# Patient Record
Sex: Male | Born: 1955 | Race: Black or African American | Hispanic: No | Marital: Single | Smoking: Never smoker
Health system: Southern US, Community
[De-identification: ages and names within clinical notes are randomized; demographics above are authoritative.]

## PROBLEM LIST (undated history)

## (undated) DIAGNOSIS — I10 Essential (primary) hypertension: Secondary | ICD-10-CM

## (undated) DIAGNOSIS — E119 Type 2 diabetes mellitus without complications: Secondary | ICD-10-CM

---

## 2000-01-02 ENCOUNTER — Encounter: Payer: Self-pay | Admitting: *Deleted

## 2000-01-02 ENCOUNTER — Emergency Department (HOSPITAL_COMMUNITY): Admission: EM | Admit: 2000-01-02 | Discharge: 2000-01-02 | Payer: Self-pay | Admitting: *Deleted

## 2001-12-06 ENCOUNTER — Ambulatory Visit (HOSPITAL_COMMUNITY): Admission: RE | Admit: 2001-12-06 | Discharge: 2001-12-06 | Payer: Self-pay | Admitting: Gastroenterology

## 2002-10-23 ENCOUNTER — Encounter: Payer: Self-pay | Admitting: Internal Medicine

## 2002-10-23 ENCOUNTER — Encounter: Admission: RE | Admit: 2002-10-23 | Discharge: 2002-10-23 | Payer: Self-pay | Admitting: Internal Medicine

## 2002-12-19 ENCOUNTER — Encounter: Payer: Self-pay | Admitting: Emergency Medicine

## 2002-12-19 ENCOUNTER — Emergency Department (HOSPITAL_COMMUNITY): Admission: EM | Admit: 2002-12-19 | Discharge: 2002-12-19 | Payer: Self-pay | Admitting: Emergency Medicine

## 2003-05-21 ENCOUNTER — Emergency Department (HOSPITAL_COMMUNITY): Admission: EM | Admit: 2003-05-21 | Discharge: 2003-05-22 | Payer: Self-pay | Admitting: Emergency Medicine

## 2005-04-09 ENCOUNTER — Emergency Department (HOSPITAL_COMMUNITY): Admission: EM | Admit: 2005-04-09 | Discharge: 2005-04-09 | Payer: Self-pay | Admitting: Emergency Medicine

## 2006-01-31 ENCOUNTER — Emergency Department (HOSPITAL_COMMUNITY): Admission: EM | Admit: 2006-01-31 | Discharge: 2006-01-31 | Payer: Self-pay | Admitting: Emergency Medicine

## 2006-02-13 ENCOUNTER — Encounter (INDEPENDENT_AMBULATORY_CARE_PROVIDER_SITE_OTHER): Payer: Self-pay | Admitting: Cardiology

## 2006-02-13 ENCOUNTER — Ambulatory Visit (HOSPITAL_COMMUNITY): Admission: RE | Admit: 2006-02-13 | Discharge: 2006-02-13 | Payer: Self-pay | Admitting: Internal Medicine

## 2006-11-28 ENCOUNTER — Emergency Department (HOSPITAL_COMMUNITY): Admission: EM | Admit: 2006-11-28 | Discharge: 2006-11-28 | Payer: Self-pay | Admitting: Emergency Medicine

## 2007-08-07 ENCOUNTER — Ambulatory Visit: Payer: Self-pay | Admitting: Cardiology

## 2007-08-07 ENCOUNTER — Ambulatory Visit (HOSPITAL_COMMUNITY): Admission: RE | Admit: 2007-08-07 | Discharge: 2007-08-07 | Payer: Self-pay | Admitting: Internal Medicine

## 2007-12-05 ENCOUNTER — Emergency Department (HOSPITAL_COMMUNITY): Admission: EM | Admit: 2007-12-05 | Discharge: 2007-12-05 | Payer: Self-pay | Admitting: Emergency Medicine

## 2008-05-01 ENCOUNTER — Emergency Department (HOSPITAL_COMMUNITY): Admission: EM | Admit: 2008-05-01 | Discharge: 2008-05-02 | Payer: Self-pay | Admitting: Emergency Medicine

## 2008-06-15 IMAGING — CR DG CHEST 1V PORT
1 series · 1 of 1 positions shown · non-contrast
Comparison: none

HISTORY: Chest pain, hypertension

PORTABLE CHEST ONE VIEW:
Upper normal heart size for technique.
Normal mediastinal contours and pulmonary vascularity.
Lungs clear.
No pleural effusion or pneumothorax.
Numerous cardiac monitoring lines project over chest.

[AP]
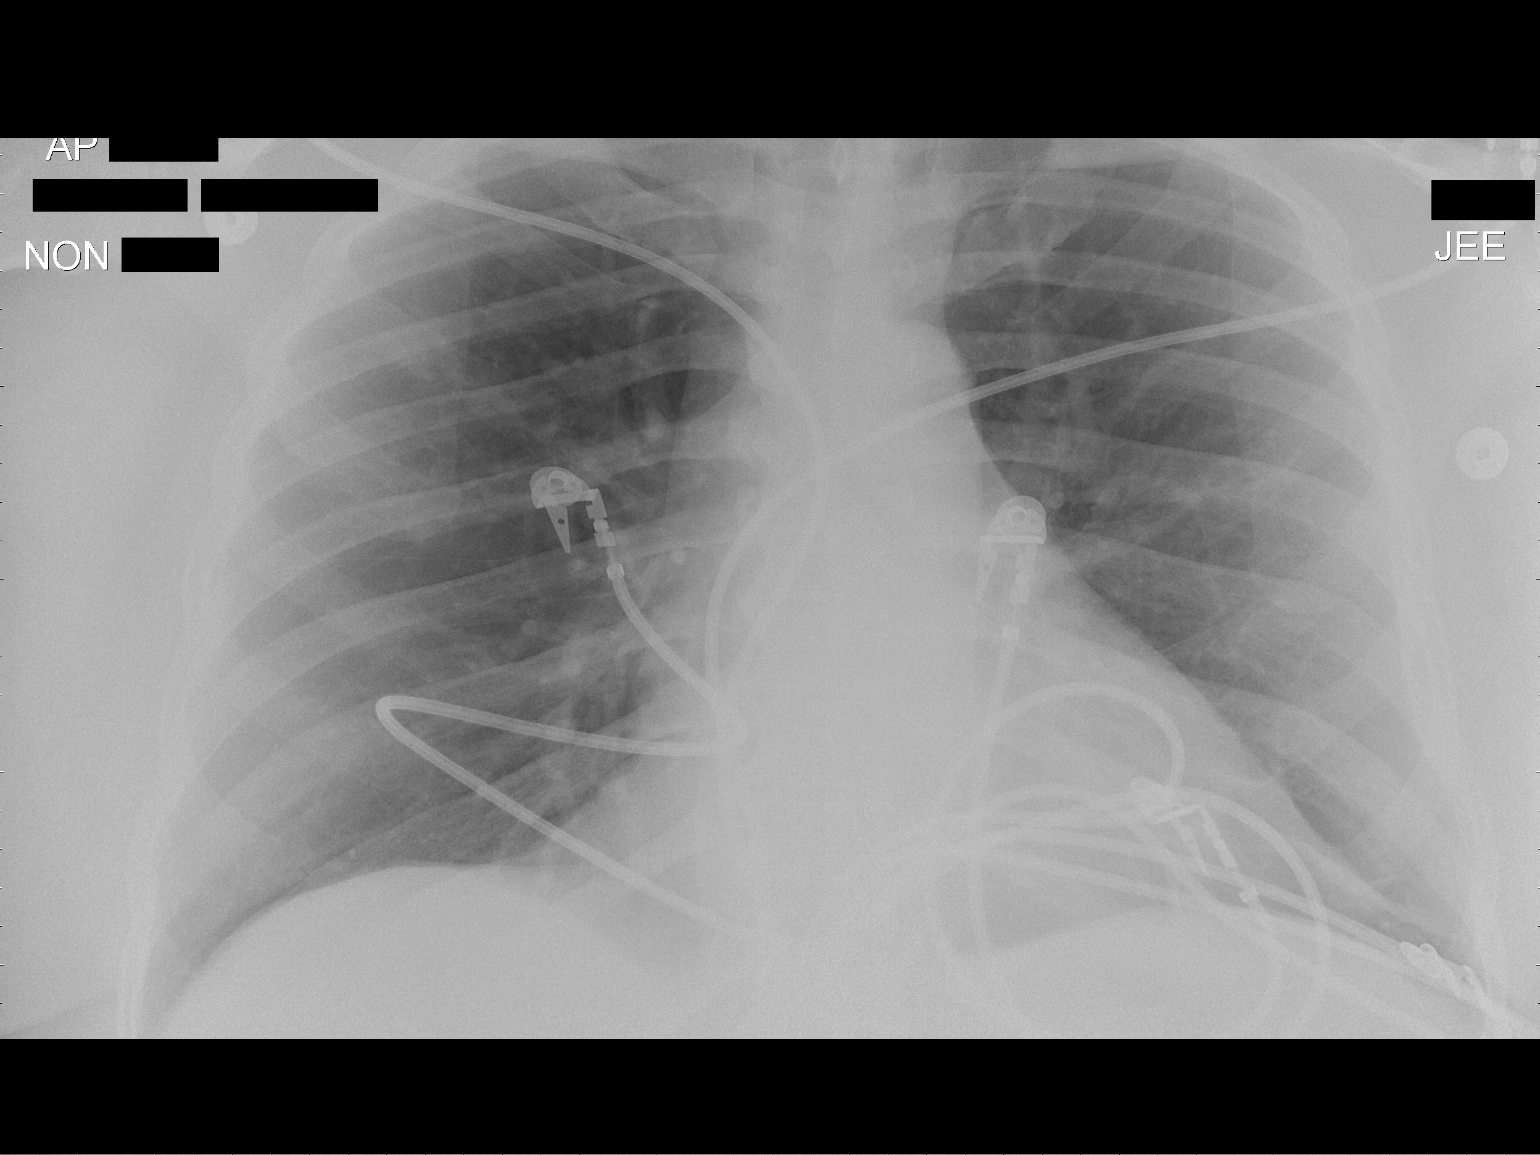

[1 of 1 positions shown; findings below may reference images not displayed]

IMPRESSION: No acute abnormalities.

## 2008-11-11 IMAGING — US US ABDOMEN COMPLETE
1 series · 14 of 25 positions shown · non-contrast
Comparison: None

CLINICAL DATA: Abdominal pain, right-sided flank pain

ABDOMEN ULTRASOUND
TECHNIQUE: Complete abdominal ultrasound examination was performed
including evaluation of the liver, gallbladder, bile ducts,
pancreas, kidneys, spleen, IVC, and abdominal aorta.

[Series 1: unknown · 0.34mm/px · 14 of 56 slices shown]
[im 1/56]
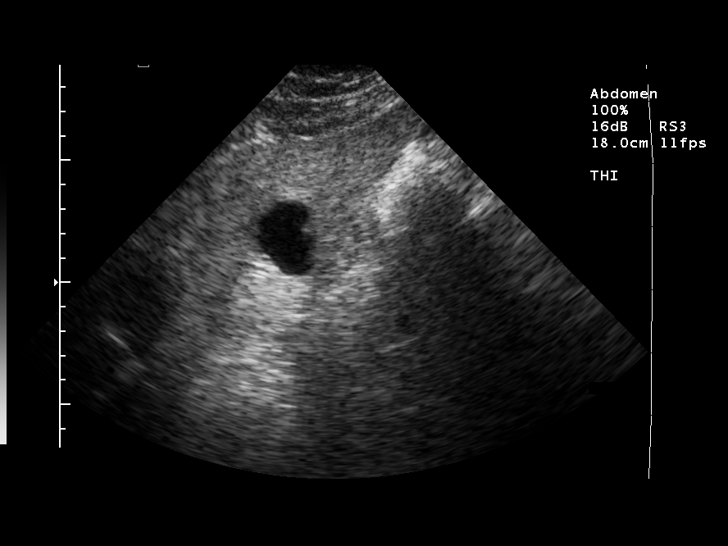
[im 5/56]
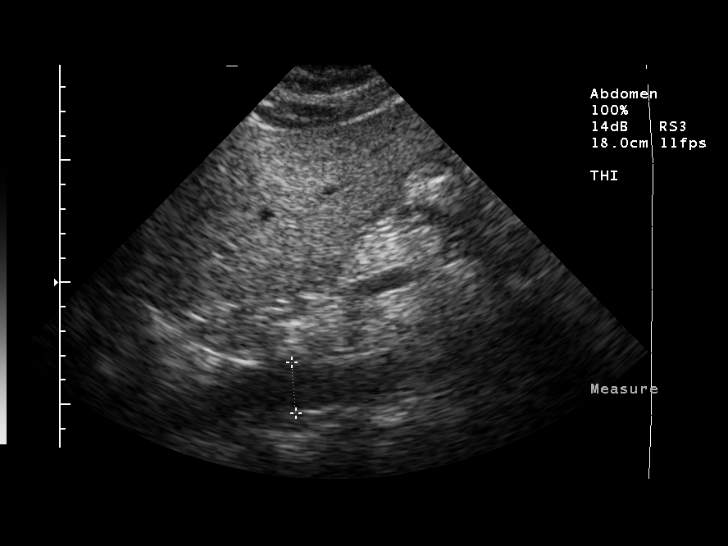
[im 10/56]
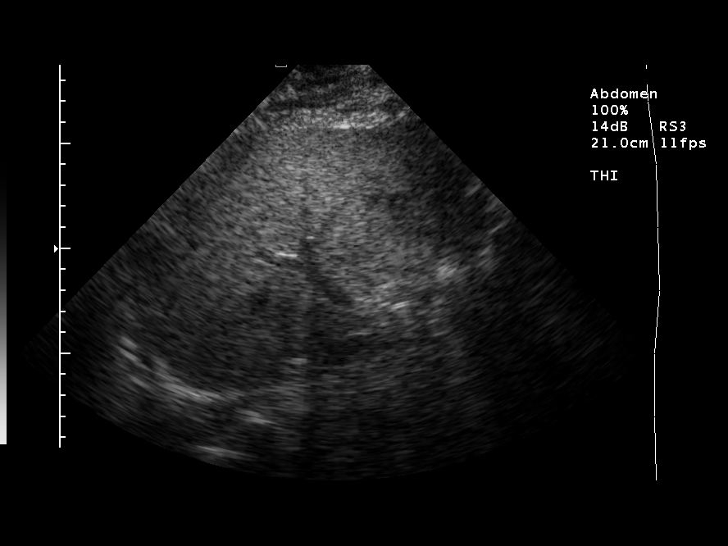
[im 14/56]
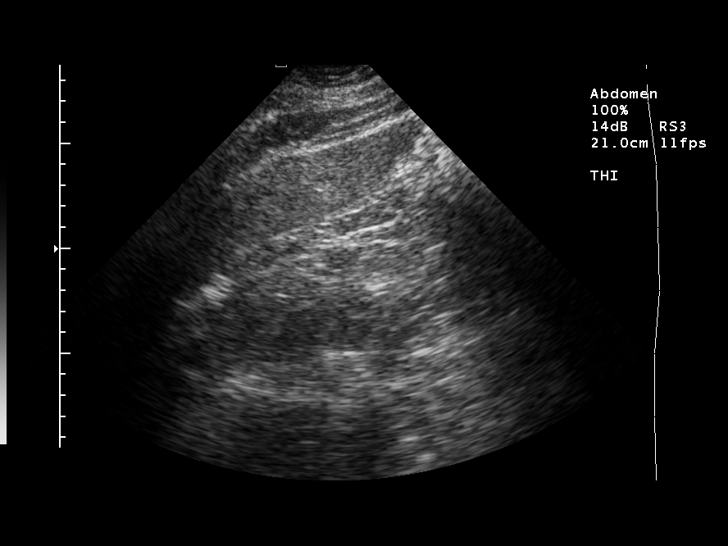
[im 19/56]
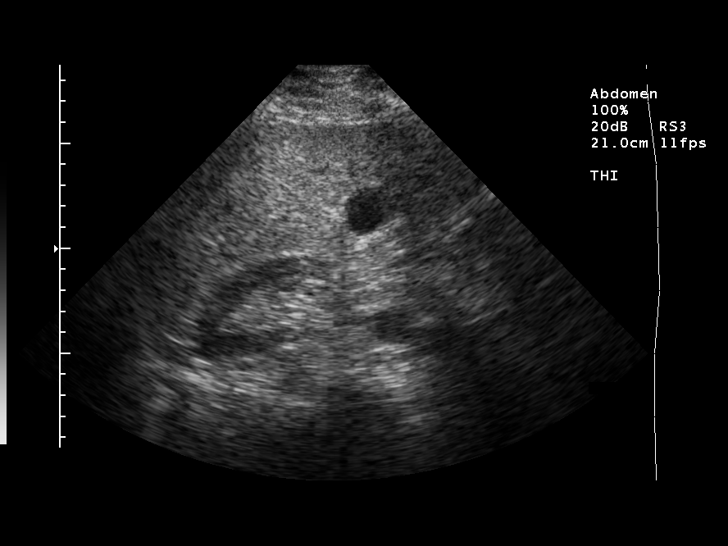
[im 21/56]
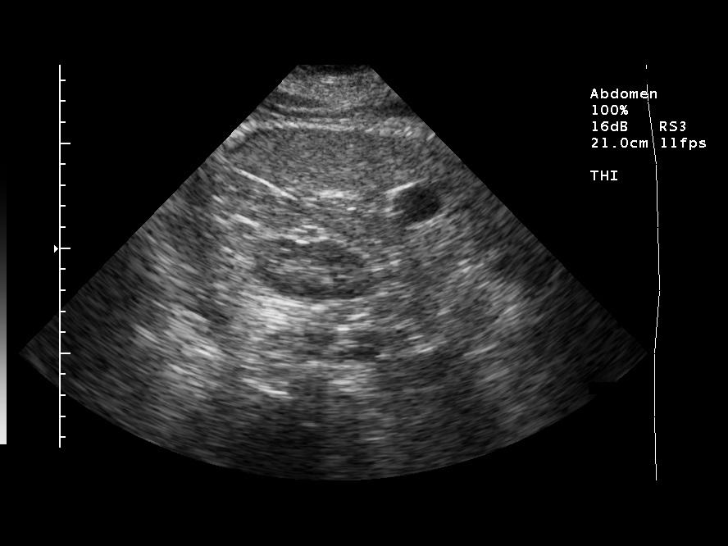
[im 26/56]
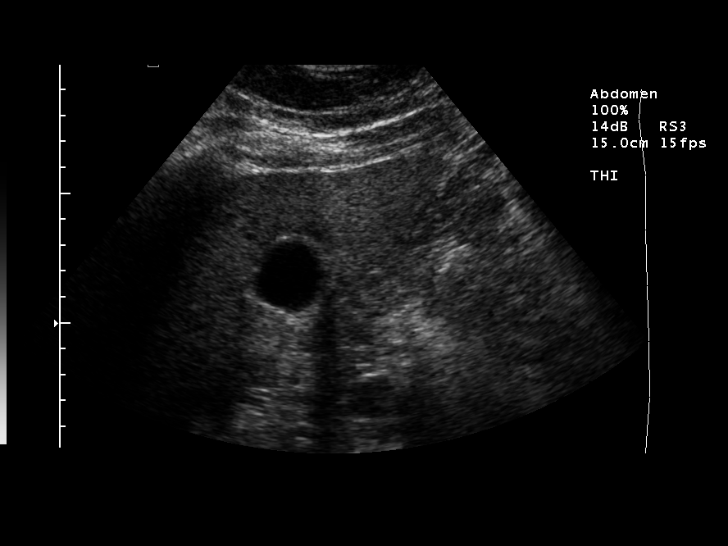
[im 30/56]
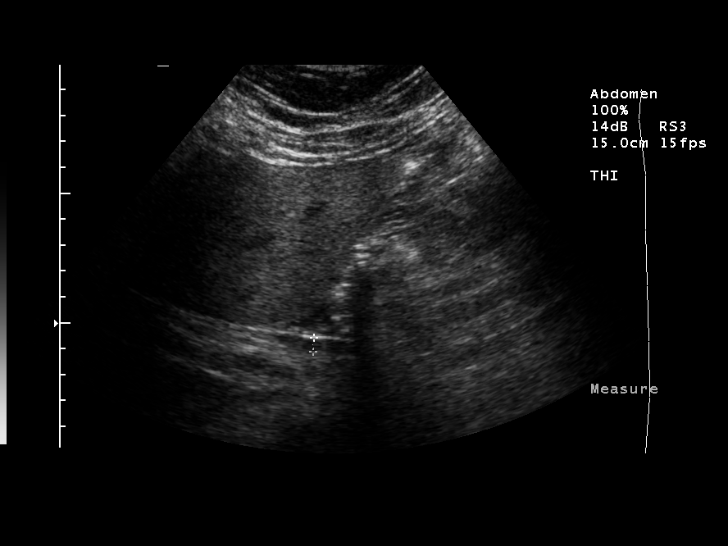
[im 35/56]
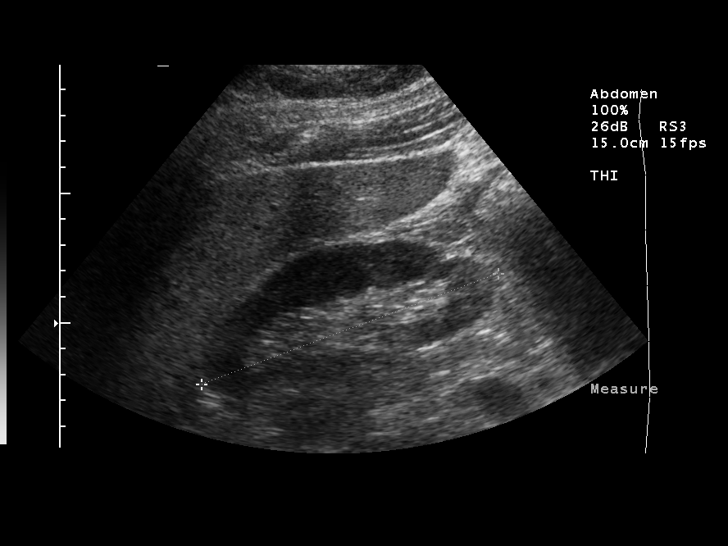
[im 37/56]
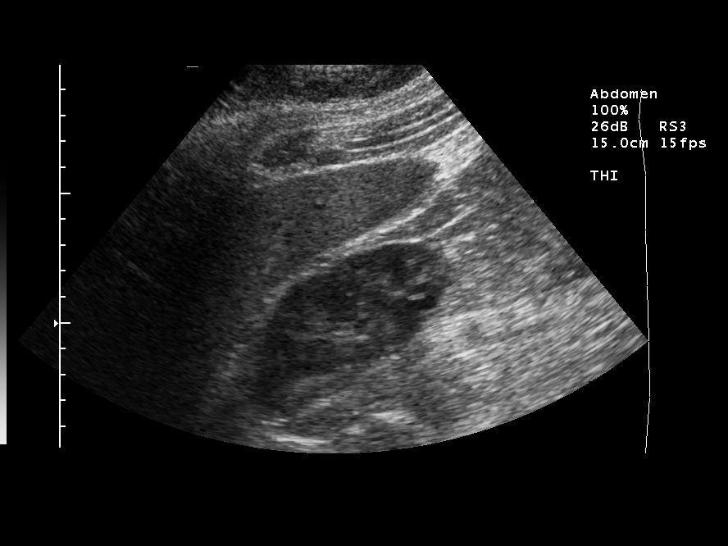
[im 42/56]
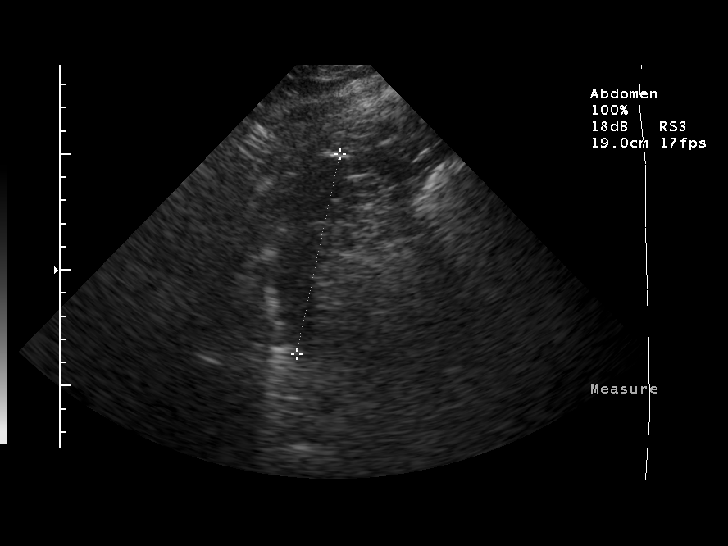
[im 46/56]
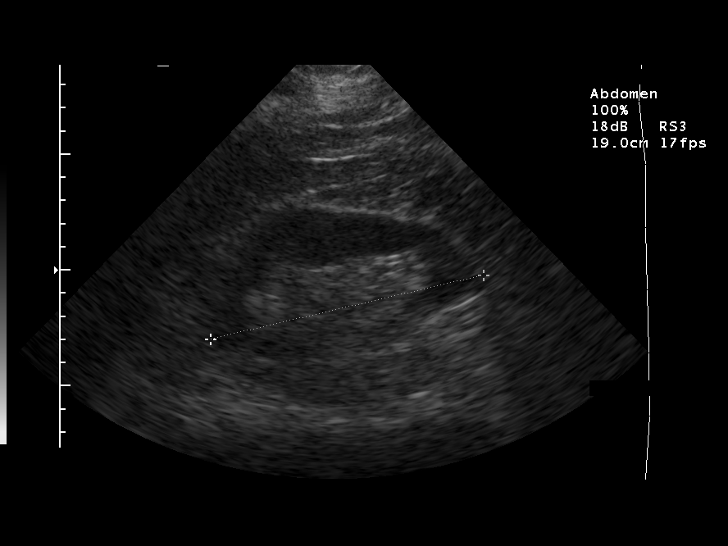
[im 51/56]
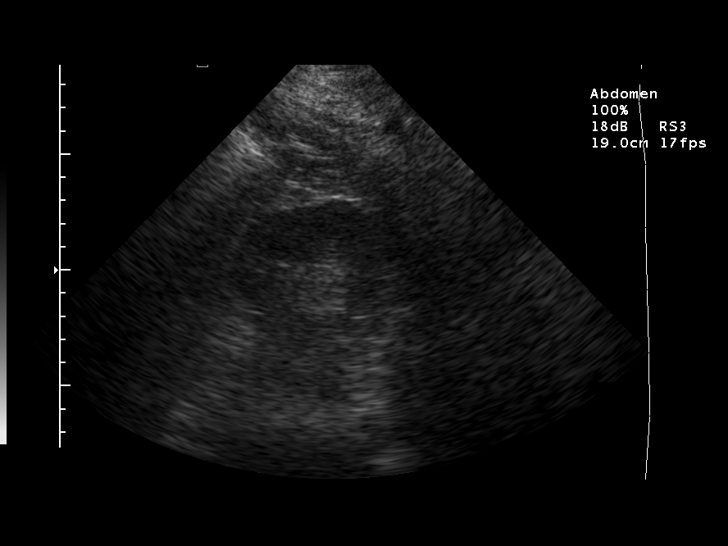
[im 56/56]
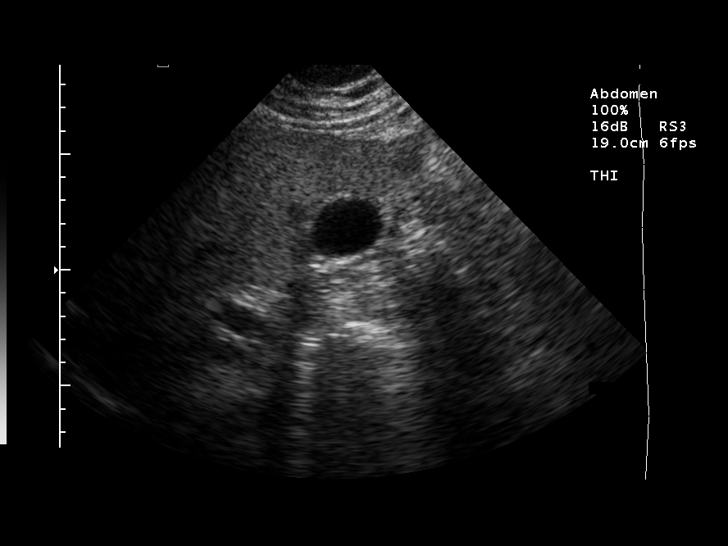

[14 of 25 positions shown; findings below may reference images not displayed]

FINDINGS: The liver is increased in echogenicity with poor sound
through transmission.  A 3.2 x 2.4 x 2.3 cm left hepatic lobe cyst
is incidentally identified.  Common duct measures 6 mm.
Gallbladder, intrahepatic IVC, spleen, and kidneys are normal.  The
pancreatic tail is not well visualized due to bowel gas, although
the head and body appear unremarkable.  Proximal abdominal aortic
diameter measures 2.1 cm, although the mid and distal aorta are
obscured by bowel gas.  No ascites.
IMPRESSION: No acute intra-abdominal finding.

Fatty liver.

## 2011-05-12 NOTE — Procedures (Signed)
[facility]  Patient:    Gary Conley, Gary Conley Visit Number: [id number] MRN: [id number]          Service Type: END Location: ENDO Attending Physician:  Louie Bun Dictated by:   Everardo All Madilyn Fireman, M.D. Proc. Date: 12/06/01 Admit Date:  12/06/2001   CC:         Lindell Spar. Chestine Spore, M.D.   Procedure Report  PROCEDURE:  Colonoscopy.  INDICATION FOR PROCEDURE:  Family history of colon cancer in a first degree relative.  DESCRIPTION OF PROCEDURE:  The patient was placed in the left lateral decubitus position then placed on the pulse monitor with continuous low flow oxygen delivered by nasal cannula. He was sedated with 70 mg IV Demerol and 7 mg IV Versed. The Olympus video colonoscope was inserted into the rectum and advanced to the cecum, confirmed by transillumination at McBurneys point and visualization of the ileocecal valve and appendiceal orifice. The prep was excellent. The cecum, ascending, transverse, descending and sigmoid colon all appeared normal with no masses, polyps, diverticula or other mucosal abnormalities. The rectum likewise appeared normal and retroflexed view of the anus revealed no obvious internal hemorrhoids. The colonoscope was then withdrawn and the patient returned to the recovery room in stable condition. The patient tolerated the procedure well and there were no immediate complications.  IMPRESSION:  Essentially normal colonoscopy.  PLAN:  Repeat colonoscopy based on his family history in approximately five years. Dictated by:   Everardo All Madilyn Fireman, M.D. Attending Physician:  Louie Bun DD:  12/06/01 TD:  12/06/01 Job: [id number]

## 2011-10-02 LAB — DIFFERENTIAL
Eosinophils Absolute: 0.1 — ABNORMAL LOW
Eosinophils Relative: 1
Lymphocytes Relative: 24
Lymphs Abs: 1.8
Monocytes Relative: 9

## 2011-10-02 LAB — CBC
HCT: 41.3
MCV: 87.4
Platelets: 302
RBC: 4.73
WBC: 7.5

## 2011-10-02 LAB — I-STAT 8, (EC8 V) (CONVERTED LAB)
Acid-Base Excess: 2
Chloride: 105
pCO2, Ven: 40.1 — ABNORMAL LOW
pH, Ven: 7.434 — ABNORMAL HIGH

## 2011-10-02 LAB — POCT I-STAT CREATININE: Creatinine, Ser: 1

## 2011-10-02 LAB — URINE MICROSCOPIC-ADD ON

## 2011-10-02 LAB — B-NATRIURETIC PEPTIDE (CONVERTED LAB): Pro B Natriuretic peptide (BNP): <30

## 2011-10-02 LAB — D-DIMER, QUANTITATIVE: D-Dimer, Quant: 0.23

## 2011-10-02 LAB — POCT CARDIAC MARKERS
Myoglobin, poc: 156
Operator id: 257131
Troponin i, poc: <0.05

## 2011-10-02 LAB — URINALYSIS, ROUTINE W REFLEX MICROSCOPIC
Glucose, UA: NEGATIVE
Ketones, ur: NEGATIVE
Leukocytes, UA: NEGATIVE
pH: 7

## 2018-09-27 ENCOUNTER — Encounter (HOSPITAL_COMMUNITY): Payer: Self-pay

## 2018-09-27 ENCOUNTER — Other Ambulatory Visit: Payer: Self-pay

## 2018-09-27 ENCOUNTER — Ambulatory Visit (HOSPITAL_COMMUNITY)
Admission: EM | Admit: 2018-09-27 | Discharge: 2018-09-27 | Disposition: A | Discharge status: Home / Self Care | Payer: BC Managed Care – PPO | Attending: Family Medicine | Admitting: Family Medicine

## 2018-09-27 DIAGNOSIS — L03116 Cellulitis of left lower limb: Secondary | ICD-10-CM

## 2018-09-27 HISTORY — DX: Type 2 diabetes mellitus without complications: E11.9

## 2018-09-27 HISTORY — DX: Essential (primary) hypertension: I10

## 2018-09-27 MED ORDER — AMOXICILLIN-POT CLAVULANATE 875-125 MG PO TABS: 1.0000 | ORAL_TABLET | Freq: Two times a day (BID) | ORAL | 0 refills | Status: AC

## 2018-09-27 MED ORDER — MUPIROCIN CALCIUM 2 % EX CREA: 1.0000 "application " | TOPICAL_CREAM | Freq: Two times a day (BID) | CUTANEOUS | 0 refills | Status: DC

## 2018-09-27 NOTE — ED Triage Notes (Signed)
Pt states he ha a wound on the back of his lower leg that want heal, it's been there for a week or more.

## 2018-09-27 NOTE — Discharge Instructions (Addendum)
It was nice meeting you!!  We will go ahead and treat you today for cellulitis to the lower extremities Please continue to monitor if the infection becomes worse please follow-up If you start experiencing fever, chills, body aches, fatigue please go to the ER Make sure you are elevating your legs

## 2018-09-27 NOTE — ED Provider Notes (Signed)
[facility]    CSN: [id number] Arrival date & time: 09/27/18  1313     History   Chief Complaint Chief Complaint  Patient presents with  . Wound Check    HPI Bradford HATIM HOMANN is a 62 y.o. male.   Patient is a 62 year old male with past medical history of diabetes and hypertension.  He reports with 1 week of worsening bilateral lower extremity edema with erythema, open sores and tenderness.  Denies any associated fever, chills, body aches, fatigue.  He denies any history of congestive heart failure, PVD, PAD.  He normally has swelling in the legs but this is more than usual.  He denies any injury to the legs.  He reports his diabetes is under control as far as he knows.  Denies any chest pain, shortness of breath, palpitations.  ROS per HPI      Wound Check     Past Medical History:  Diagnosis Date  . Diabetes mellitus without complication (HCC)   . Hypertension     There are no active problems to display for this patient.   History reviewed. No pertinent surgical history.     Home Medications    Prior to Admission medications   Medication Sig Start Date End Date Taking? Authorizing Provider  amLODipine-olmesartan (AZOR) 10-40 MG tablet TK 1 T PO QD 07/01/18   [provider]  amoxicillin-clavulanate (AUGMENTIN) 875-125 MG tablet Take 1 tablet by mouth every 12 (twelve) hours. 09/27/18   Dahlia Byes A, NP  mupirocin cream (BACTROBAN) 2 % Apply 1 application topically 2 (two) times daily. 09/27/18   Dahlia Byes A, NP  polyethylene glycol-electrolytes (NULYTELY/GOLYTELY) 420 g solution MIX AND DRINK UTD 07/23/18   [provider]  risperiDONE (RISPERDAL) 1 MG tablet TAKE 1 AND 1/2 TABLET BY MOUTH AT BEDTIME 07/05/18   [provider]    Family History History reviewed. No pertinent family history.  Social History Social History   Tobacco Use  . Smoking status: Never Smoker  . Smokeless tobacco: Never Used  Substance Use Topics   . Alcohol use: Not Currently  . Drug use: Never     Allergies   Patient has no allergy information on record.   Review of Systems Review of Systems   Physical Exam Triage Vital Signs ED Triage Vitals  Enc Vitals Group     BP 09/27/18 1334 134/75     Pulse Rate 09/27/18 1334 78     Resp 09/27/18 1334 20     Temp 09/27/18 1334 99 F (37.2 C)     Temp Source 09/27/18 1334 Oral     SpO2 09/27/18 1334 100 %     Weight 09/27/18 1332 250 lb (113.4 kg)     Height --      Head Circumference --      Peak Flow --      Pain Score --      Pain Loc --      Pain Edu? --      Excl. in GC? --    No data found.  Updated Vital Signs BP 134/75 (BP Location: Left Arm)   Pulse 78   Temp 99 F (37.2 C) (Oral)   Resp 20   Wt 250 lb (113.4 kg)   SpO2 100%   Visual Acuity Right Eye Distance:   Left Eye Distance:   Bilateral Distance:    Right Eye Near:   Left Eye Near:    Bilateral Near:  Physical Exam  Constitutional: He is oriented to person, place, and time. He appears well-developed and well-nourished.  Very pleasant. Non toxic or ill appearing.   HENT:  Head: Normocephalic and atraumatic.  Eyes: Conjunctivae are normal.  Neck: Normal range of motion.  Cardiovascular: Normal rate, regular rhythm and normal pulses.  Pulmonary/Chest: Effort normal and breath sounds normal.  Lungs clear throughout lung fields.  No crackles heard  Musculoskeletal: Normal range of motion.  Neurological: He is alert and oriented to person, place, and time.  Skin: Skin is warm and dry.  Bilateral lower extremity 2+ pitting edema with surrounding erythema and multiple open sores on posterior lower legs.  Foul smell noted from sores with yellow drainage.    Psychiatric: He has a normal mood and affect.  Nursing note and vitals reviewed.        UC Treatments / Results  Labs (all labs ordered are listed, but only abnormal results are displayed) Labs Reviewed - No data to  display  EKG None  Radiology No results found.  Procedures Procedures (including critical care time)  Medications Ordered in UC Medications - No data to display  Initial Impression / Assessment and Plan / UC Course  I have reviewed the triage vital signs and the nursing notes.  Pertinent labs & imaging results that were available during my care of the patient were reviewed by me and considered in my medical decision making (see chart for details).     Cellulitis-we will treat with Augmentin and Bactroban ointment Strict precautions to return for worsening symptoms or if not seeing any improvement in the next 48 hours. Elevate legs  Final Clinical Impressions(s) / UC Diagnoses   Final diagnoses:  Cellulitis of left lower extremity     Discharge Instructions     It was nice meeting you!!  We will go ahead and treat you today for cellulitis to the lower extremities Please continue to monitor if the infection becomes worse please follow-up If you start experiencing fever, chills, body aches, fatigue please go to the ER Make sure you are elevating your legs     ED Prescriptions    Medication Sig Dispense Auth. Provider   amoxicillin-clavulanate (AUGMENTIN) 875-125 MG tablet Take 1 tablet by mouth every 12 (twelve) hours. 14 tablet Daymian Lill A, NP   mupirocin cream (BACTROBAN) 2 % Apply 1 application topically 2 (two) times daily. 15 g Dahlia Byes A, NP     Controlled Substance Prescriptions [place] Controlled Substance Registry consulted? Not Applicable   Janace Aris, NP 09/27/18 1413

## 2018-10-04 ENCOUNTER — Ambulatory Visit (HOSPITAL_COMMUNITY)
Admission: EM | Admit: 2018-10-04 | Discharge: 2018-10-04 | Disposition: A | Discharge status: Home / Self Care | Payer: BC Managed Care – PPO | Attending: Family Medicine | Admitting: Family Medicine

## 2018-10-04 ENCOUNTER — Encounter (HOSPITAL_COMMUNITY): Payer: Self-pay | Admitting: Emergency Medicine

## 2018-10-04 DIAGNOSIS — Z5189 Encounter for other specified aftercare: Secondary | ICD-10-CM

## 2018-10-04 DIAGNOSIS — L03116 Cellulitis of left lower limb: Secondary | ICD-10-CM

## 2018-10-04 MED ORDER — MUPIROCIN CALCIUM 2 % EX CREA: 1.0000 "application " | TOPICAL_CREAM | Freq: Two times a day (BID) | CUTANEOUS | 2 refills | Status: AC

## 2018-10-04 MED ORDER — FUROSEMIDE 20 MG PO TABS: 20.0000 mg | ORAL_TABLET | Freq: Every day | ORAL | 0 refills | Status: AC

## 2018-10-04 NOTE — Discharge Instructions (Addendum)
The infection in your legs appears to be healing Keep using the Bactroban ointment and keeping the wounds covered Plain Dove soap to clean the legs No peroxide, Neosporin or anything else to dry the skin out and make it cracked more We are giving you 20 mg of Lasix to take daily for the next 2 weeks to help decrease fluid. Be aware this will make you go to the bathroom more often Try to elevate your legs is much as possible Follow-up for recheck in the next couple weeks Compression stockings could help with the swelling

## 2018-10-04 NOTE — ED Provider Notes (Signed)
[facility]    CSN: [id number] Arrival date & time: 10/04/18  1458     History   Chief Complaint Chief Complaint  Patient presents with  . Follow-up  . Cellulitis    HPI Gary Conley is a 62 y.o. male.   Patient is a 62 year old male who presents for follow-up for cellulitis.  He was seen here 1 week ago and diagnosed with bilateral lower extremity cellulitis.  He reports that he feels like the infection has improved.  He is having less pain, swelling, redness to the legs.  He finished his last dose of Augmentin this morning.  He has been using the Bactroban cream on the open wounds and wrapping the legs.  He is almost out of the Bactroban cream. He is not having any fever, chills, body chills.  Has any chest pain, shortness of breath.   ROS per HPI      Past Medical History:  Diagnosis Date  . Diabetes mellitus without complication (HCC)   . Hypertension     There are no active problems to display for this patient.   History reviewed. No pertinent surgical history.     Home Medications    Prior to Admission medications   Medication Sig Start Date End Date Taking? Authorizing Provider  amLODipine-olmesartan (AZOR) 10-40 MG tablet TK 1 T PO QD 07/01/18  Yes [provider]  amoxicillin-clavulanate (AUGMENTIN) 875-125 MG tablet Take 1 tablet by mouth every 12 (twelve) hours. 09/27/18  Yes Indea Dearman A, NP  furosemide (LASIX) 20 MG tablet Take 1 tablet (20 mg total) by mouth daily. 10/04/18   Dahlia Byes A, NP  mupirocin cream (BACTROBAN) 2 % Apply 1 application topically 2 (two) times daily. 10/04/18   Dahlia Byes A, NP  polyethylene glycol-electrolytes (NULYTELY/GOLYTELY) 420 g solution MIX AND DRINK UTD 07/23/18   [provider]  risperiDONE (RISPERDAL) 1 MG tablet TAKE 1 AND 1/2 TABLET BY MOUTH AT BEDTIME 07/05/18   [provider]    Family History History reviewed. No pertinent family history.  Social History Social  History   Tobacco Use  . Smoking status: Never Smoker  . Smokeless tobacco: Never Used  Substance Use Topics  . Alcohol use: Not Currently  . Drug use: Never     Allergies   Patient has no known allergies.   Review of Systems Review of Systems   Physical Exam Triage Vital Signs ED Triage Vitals  Enc Vitals Group     BP 10/04/18 1609 138/72     Pulse Rate 10/04/18 1609 88     Resp 10/04/18 1609 16     Temp 10/04/18 1609 98.7 F (37.1 C)     Temp Source 10/04/18 1609 Oral     SpO2 10/04/18 1609 97 %     Weight --      Height --      Head Circumference --      Peak Flow --      Pain Score 10/04/18 1611 2     Pain Loc --      Pain Edu? --      Excl. in GC? --    No data found.  Updated Vital Signs BP 138/72 (BP Location: Right Arm)   Pulse 88   Temp 98.7 F (37.1 C) (Oral)   Resp 16   SpO2 97%   Visual Acuity Right Eye Distance:   Left Eye Distance:   Bilateral Distance:    Right Eye Near:  Left Eye Near:    Bilateral Near:     Physical Exam  Constitutional: He is oriented to person, place, and time. He appears well-developed and well-nourished.  HENT:  Head: Normocephalic and atraumatic.  Eyes: Conjunctivae are normal.  Neck: Normal range of motion.  Pulmonary/Chest: Effort normal.  Musculoskeletal: Normal range of motion.  Neurological: He is alert and oriented to person, place, and time.  Skin: Skin is warm and dry.  Redness and  swelling has improved to bilateral lower extremities.  Open wounds noted but they were there previously. No oozing, draining or foul smell like previous visit.  No pain with palpation of bilateral lower extremities  Psychiatric: He has a normal mood and affect.  Nursing note and vitals reviewed.    UC Treatments / Results  Labs (all labs ordered are listed, but only abnormal results are displayed) Labs Reviewed - No data to display  EKG None  Radiology No results found.  Procedures Procedures (including  critical care time)  Medications Ordered in UC Medications - No data to display  Initial Impression / Assessment and Plan / UC Course  I have reviewed the triage vital signs and the nursing notes.  Pertinent labs & imaging results that were available during my care of the patient were reviewed by me and considered in my medical decision making (see chart for details).     Cellulitis has improved Patient instructed to continue the Bactroban ointment and keep the wounds covered No need for more treatment with oral antibiotics. Patient still having some swelling in both lower extremities, although decreased we will go ahead and treat with Lasix x2 weeks to help with the swelling. Explained to patient that this may increase urination Instructed to follow-up in the next couple weeks for recheck Final Clinical Impressions(s) / UC Diagnoses   Final diagnoses:  Visit for wound check     Discharge Instructions     The infection in your legs appears to be healing Keep using the Bactroban ointment and keeping the wounds covered Plain Dove soap to clean the legs No peroxide, Neosporin or anything else to dry the skin out and make it cracked more We are giving you 20 mg of Lasix to take daily for the next 2 weeks to help decrease fluid. Be aware this will make you go to the bathroom more often Try to elevate your legs is much as possible Follow-up for recheck in the next couple weeks Compression stockings could help with the swelling    ED Prescriptions    Medication Sig Dispense Auth. Provider   mupirocin cream (BACTROBAN) 2 % Apply 1 application topically 2 (two) times daily. 30 g Colena Ketterman A, NP   furosemide (LASIX) 20 MG tablet Take 1 tablet (20 mg total) by mouth daily. 14 tablet Dahlia Byes A, NP     Controlled Substance Prescriptions [place] Controlled Substance Registry consulted? Not Applicable   Janace Aris, NP 10/04/18 1637

## 2018-10-04 NOTE — ED Triage Notes (Signed)
Here for a f/u on LLE cellulitis... Seen on 09/27/18 ... Pt reports he has finished his antibiotics   Reports sx are getting better... Swelling has decreased ... Just wants to make sure everything is ok.   A&O x4... NAD... Ambulatory

## 2018-10-28 ENCOUNTER — Other Ambulatory Visit (HOSPITAL_COMMUNITY): Payer: Self-pay | Admitting: Internal Medicine

## 2018-10-28 DIAGNOSIS — R600 Localized edema: Secondary | ICD-10-CM

## 2018-10-28 DIAGNOSIS — E8779 Other fluid overload: Secondary | ICD-10-CM

## 2018-10-30 ENCOUNTER — Ambulatory Visit (HOSPITAL_COMMUNITY): Payer: BC Managed Care – PPO | Attending: Cardiology

## 2018-10-30 ENCOUNTER — Other Ambulatory Visit: Payer: Self-pay

## 2018-10-30 DIAGNOSIS — R600 Localized edema: Secondary | ICD-10-CM | POA: Diagnosis present

## 2018-10-30 DIAGNOSIS — E8779 Other fluid overload: Secondary | ICD-10-CM | POA: Diagnosis not present

## 2020-03-04 ENCOUNTER — Ambulatory Visit: Payer: BC Managed Care – PPO | Attending: Family

## 2020-03-04 DIAGNOSIS — Z23 Encounter for immunization: Secondary | ICD-10-CM

## 2020-03-04 NOTE — Progress Notes (Signed)
   Covid-19 Vaccination Clinic  Name:  Gary Conley    MRN: [id number] DOB: Dec 26, 1955  03/04/2020  Mr. Gary Conley was observed post Covid-19 immunization for 15 minutes without incident. He was provided with Vaccine Information Sheet and instruction to access the V-Safe system.   Mr. Gary Conley was instructed to call 911 with any severe reactions post vaccine: Marland Kitchen Difficulty breathing  . Swelling of face and throat  . A fast heartbeat  . A bad rash all over body  . Dizziness and weakness   Immunizations Administered    Name Date Dose VIS Date Route   Moderna COVID-19 Vaccine 03/04/2020 11:35 AM 0.5 mL 11/25/2019 Intramuscular   Manufacturer: Moderna   Lot: [id number]   NDC: 16579-038-33

## 2020-04-06 ENCOUNTER — Ambulatory Visit: Payer: BC Managed Care – PPO | Attending: Family

## 2020-04-06 DIAGNOSIS — Z23 Encounter for immunization: Secondary | ICD-10-CM

## 2020-04-06 NOTE — Progress Notes (Signed)
   Covid-19 Vaccination Clinic  Name:  Gary Conley    MRN: [id number] DOB: 12-Feb-1956  04/06/2020  [name] was observed post Covid-19 immunization for 15 minutes without incident. He was provided with Vaccine Information Sheet and instruction to access the V-Safe system.   [name] was instructed to call 911 with any severe reactions post vaccine: Marland Kitchen Difficulty breathing  . Swelling of face and throat  . A fast heartbeat  . A bad rash all over body  . Dizziness and weakness   Immunizations Administered    Name Date Dose VIS Date Route   Moderna COVID-19 Vaccine 04/06/2020  3:57 PM 0.5 mL 11/25/2019 Intramuscular   Manufacturer: Moderna   Lot: [id number]   NDC: 69794-801-65

## 2020-10-21 ENCOUNTER — Ambulatory Visit: Payer: BC Managed Care – PPO | Attending: Internal Medicine

## 2020-10-21 DIAGNOSIS — Z23 Encounter for immunization: Secondary | ICD-10-CM

## 2020-10-21 NOTE — Progress Notes (Signed)
   Covid-19 Vaccination Clinic  Name:  Gary Conley    MRN: [id number] DOB: 1956-01-14  10/21/2020  Gary Conley was observed post Covid-19 immunization for 15 minutes without incident. He was provided with Vaccine Information Sheet and instruction to access the V-Safe system.   Gary Conley was instructed to call 911 with any severe reactions post vaccine: Marland Kitchen Difficulty breathing  . Swelling of face and throat  . A fast heartbeat  . A bad rash all over body  . Dizziness and weakness
